# Patient Record
Sex: Male | Born: 1970 | Race: White | Hispanic: No | Marital: Married | Smoking: Never smoker
Health system: Southern US, Community
[De-identification: ages and names within clinical notes are randomized; demographics above are authoritative.]

## PROBLEM LIST (undated history)

## (undated) HISTORY — PX: HERNIA REPAIR: SHX51

---

## 2010-07-17 ENCOUNTER — Ambulatory Visit: Payer: Self-pay | Admitting: General Surgery

## 2010-12-02 ENCOUNTER — Ambulatory Visit: Payer: Self-pay

## 2011-01-01 ENCOUNTER — Ambulatory Visit: Payer: Self-pay | Admitting: Internal Medicine

## 2012-09-16 ENCOUNTER — Ambulatory Visit: Payer: Self-pay

## 2012-09-16 LAB — COMPREHENSIVE METABOLIC PANEL
Albumin: 4.2 g/dL (ref 3.4–5.0)
Anion Gap: 9 (ref 7–16)
BUN: 13 mg/dL (ref 7–18)
Chloride: 106 mmol/L (ref 98–107)
Co2: 29 mmol/L (ref 21–32)
Creatinine: 0.96 mg/dL (ref 0.60–1.30)
EGFR (African American): >60
EGFR (Non-African Amer.): >60
Glucose: 71 mg/dL (ref 65–99)
Potassium: 3.8 mmol/L (ref 3.5–5.1)
SGPT (ALT): 31 U/L (ref 12–78)
Sodium: 144 mmol/L (ref 136–145)

## 2012-09-16 LAB — CBC WITH DIFFERENTIAL/PLATELET
Basophil #: 0.1 10*3/uL (ref 0.0–0.1)
Basophil %: 1.5 %
Eosinophil #: 0.1 10*3/uL (ref 0.0–0.7)
HGB: 14.7 g/dL (ref 13.0–18.0)
Lymphocyte %: 27.7 %
MCHC: 33.8 g/dL (ref 32.0–36.0)
MCV: 86 fL (ref 80–100)
Monocyte %: 7.3 %
Neutrophil #: 2.6 10*3/uL (ref 1.4–6.5)
Neutrophil %: 60.7 %
RBC: 5.04 10*6/uL (ref 4.40–5.90)

## 2014-03-10 ENCOUNTER — Ambulatory Visit: Payer: Self-pay | Admitting: Physician Assistant

## 2014-10-07 ENCOUNTER — Ambulatory Visit
Admission: EM | Admit: 2014-10-07 | Discharge: 2014-10-07 | Disposition: A | Discharge status: Home / Self Care | Payer: Private Health Insurance - Indemnity | Attending: Family Medicine | Admitting: Family Medicine

## 2014-10-07 ENCOUNTER — Encounter: Payer: Self-pay | Admitting: Emergency Medicine

## 2014-10-07 DIAGNOSIS — J0100 Acute maxillary sinusitis, unspecified: Secondary | ICD-10-CM | POA: Diagnosis not present

## 2014-10-07 MED ORDER — AZITHROMYCIN 250 MG PO TABS: ORAL_TABLET | ORAL | Status: AC

## 2014-10-07 NOTE — ED Notes (Signed)
Patient c/o sinus pain and pressure on the right side of his face for a week.

## 2014-10-07 NOTE — ED Provider Notes (Signed)
CSN: [id number]     Arrival date & time 10/07/14  1046 History   First MD Initiated Contact with Patient 10/07/14 1126     Chief Complaint  Patient presents with  . Facial Pain   (Consider location/radiation/quality/duration/timing/severity/associated sxs/prior Treatment) Patient is a 44 y.o. male presenting with URI. The history is provided by the patient.  URI Presenting symptoms: congestion, facial pain and rhinorrhea   Severity:  Moderate Onset quality:  Sudden Duration:  3 weeks Timing:  Constant Progression:  Worsening Chronicity:  New Relieved by:  Nothing Associated symptoms: headaches and sinus pain   Associated symptoms: no arthralgias, no myalgias, no sneezing, no swollen glands and no wheezing     History reviewed. No pertinent past medical history. Past Surgical History  Procedure Laterality Date  . Hernia repair     History reviewed. No pertinent family history. History  Substance Use Topics  . Smoking status: Never Smoker   . Smokeless tobacco: Never Used  . Alcohol Use: Yes    Review of Systems  HENT: Positive for congestion and rhinorrhea. Negative for sneezing.   Respiratory: Negative for wheezing.   Musculoskeletal: Negative for myalgias and arthralgias.  Neurological: Positive for headaches.    Allergies  Shrimp and Penicillins  Home Medications   Prior to Admission medications   Medication Sig Start Date End Date Taking? Authorizing Provider  azithromycin (ZITHROMAX Z-PAK) 250 MG tablet 2 tabs po once on day 1, then 1 tab po qd for next 4 days 10/07/14   Payton Mccallum, MD   BP 125/86 mmHg  Pulse 85  Temp(Src) 97.4 F (36.3 C) (Tympanic)  Resp 16  Ht  (1.727 m)  Wt 178 lb (80.74 kg)  BMI 27.07 kg/m2  SpO2 100% Physical Exam  Constitutional: He appears well-developed and well-nourished. No distress.  HENT:  Head: Normocephalic and atraumatic.  Right Ear: External ear and ear canal normal. A middle ear effusion is present.  Left  Ear: Tympanic membrane, external ear and ear canal normal.  Nose: Right sinus exhibits maxillary sinus tenderness and frontal sinus tenderness. Left sinus exhibits no maxillary sinus tenderness and no frontal sinus tenderness.  Mouth/Throat: Uvula is midline and mucous membranes are normal. Posterior oropharyngeal erythema present. No oropharyngeal exudate, posterior oropharyngeal edema or tonsillar abscesses.  Eyes: Conjunctivae and EOM are normal. Pupils are equal, round, and reactive to light. Right eye exhibits no discharge. Left eye exhibits no discharge. No scleral icterus.  Neck: Normal range of motion. Neck supple. No tracheal deviation present. No thyromegaly present.  Cardiovascular: Normal rate, regular rhythm and normal heart sounds.   Pulmonary/Chest: Effort normal and breath sounds normal. No stridor. No respiratory distress. He has no wheezes. He has no rales. He exhibits no tenderness.  Lymphadenopathy:    He has no cervical adenopathy.  Neurological: He is alert.  Skin: Skin is warm and dry. No rash noted. He is not diaphoretic.  Nursing note and vitals reviewed.   ED Course  Procedures (including critical care time) Labs Review Labs Reviewed - No data to display  Imaging Review No results found.   MDM   1. Acute maxillary sinusitis, recurrence not specified    New Prescriptions   AZITHROMYCIN (ZITHROMAX Z-PAK) 250 MG TABLET    2 tabs po once on day 1, then 1 tab po qd for next 4 days   Plan: 1.  diagnosis reviewed with patient 2. rx as per orders; risks, benefits, potential side effects reviewed with patient 3. Recommend  supportive treatment with otc decongestants, steroid nasal spray 4. F/u prn if symptoms worsen or don't improve    Payton Mccallumrlando Keith Felten, MD 10/07/14 1142

## 2016-09-23 ENCOUNTER — Ambulatory Visit
Admission: EM | Admit: 2016-09-23 | Discharge: 2016-09-23 | Disposition: A | Discharge status: Home / Self Care | Payer: PRIVATE HEALTH INSURANCE | Attending: Family Medicine | Admitting: Family Medicine

## 2016-09-23 ENCOUNTER — Encounter: Payer: Self-pay | Admitting: *Deleted

## 2016-09-23 DIAGNOSIS — W57XXXA Bitten or stung by nonvenomous insect and other nonvenomous arthropods, initial encounter: Secondary | ICD-10-CM | POA: Diagnosis not present

## 2016-09-23 DIAGNOSIS — L03116 Cellulitis of left lower limb: Secondary | ICD-10-CM

## 2016-09-23 DIAGNOSIS — S70362A Insect bite (nonvenomous), left thigh, initial encounter: Secondary | ICD-10-CM | POA: Diagnosis not present

## 2016-09-23 MED ORDER — DOXYCYCLINE HYCLATE 100 MG PO CAPS: 100.0000 mg | ORAL_CAPSULE | Freq: Two times a day (BID) | ORAL | 0 refills | Status: AC

## 2016-09-23 MED ORDER — MUPIROCIN 2 % EX OINT: 1.0000 "application " | TOPICAL_OINTMENT | Freq: Two times a day (BID) | CUTANEOUS | 0 refills | Status: AC

## 2016-09-23 NOTE — ED Provider Notes (Signed)
[facility]    CSN: [id number] Arrival date & time: 09/23/16  1037     History   Chief Complaint Chief Complaint  Patient presents with  . Insect Bite    HPI Ansen Sayegh. is a 46 y.o. male.   Patient is a 46 year old white male who presents with his wife after removing a tick off of his left lower thigh on Saturday since the removal of the tick on the dorsal surface of his left leg he has developed redness and swelling and some drainage from the site of the tick bite. He was concerned and came in to be seen and evaluated. He's had a history of hernia repair but he has no chronic medical problems. According to his wife his family is very healthy no pertinent family medical history relevant to today's visit. He does not smoke. He is allergic to shellfish and penicillin.   The history is provided by the patient. No language interpreter was used.  Abscess  Location:  Leg Leg abscess location:  L upper leg Abscess quality: draining, induration, itching, painful, redness and warmth   Abscess quality: no fluctuance and not weeping   Duration:  2 days Progression:  Worsening Pain details:    Quality:  Sharp   Severity:  Mild   Progression:  Worsening Chronicity:  New Context: insect bite/sting   Context: not diabetes, not immunosuppression and not injected drug use     History reviewed. No pertinent past medical history.  There are no active problems to display for this patient.   Past Surgical History:  Procedure Laterality Date  . HERNIA REPAIR         Home Medications    Prior to Admission medications   Medication Sig Start Date End Date Taking? Authorizing Provider  azithromycin (ZITHROMAX Z-PAK) 250 MG tablet 2 tabs po once on day 1, then 1 tab po qd for next 4 days 10/07/14   Payton Mccallum, MD  doxycycline (VIBRAMYCIN) 100 MG capsule Take 1 capsule (100 mg total) by mouth 2 (two) times daily. 09/23/16   Hassan Rowan, MD  mupirocin ointment  (BACTROBAN) 2 % Apply 1 application topically 2 (two) times daily. 09/23/16   Hassan Rowan, MD    Family History History reviewed. No pertinent family history.  Social History Social History  Substance Use Topics  . Smoking status: Never Smoker  . Smokeless tobacco: Never Used  . Alcohol use Yes     Allergies   Shrimp [shellfish allergy] and Penicillins   Review of Systems Review of Systems  Skin: Positive for rash and wound.     Physical Exam Triage Vital Signs ED Triage Vitals  Enc Vitals Group     BP 09/23/16 1108 124/88     Pulse Rate 09/23/16 1108 71     Resp 09/23/16 1108 16     Temp 09/23/16 1108 97.9 F (36.6 C)     Temp Source 09/23/16 1108 Oral     SpO2 09/23/16 1108 99 %     Weight 09/23/16 1109 177 lb (80.3 kg)     Height 09/23/16 1109 5\' 7"  (1.702 m)     Head Circumference --      Peak Flow --      Pain Score --      Pain Loc --      Pain Edu? --      Excl. in GC? --    No data found.   Updated Vital Signs BP 124/88 (  BP Location: Left Arm)   Pulse 71   Temp 97.9 F (36.6 C) (Oral)   Resp 16   Ht 5\' 7"  (1.702 m)   Wt 177 lb (80.3 kg)   SpO2 99%   BMI 27.72 kg/m   Visual Acuity Right Eye Distance:   Left Eye Distance:   Bilateral Distance:    Right Eye Near:   Left Eye Near:    Bilateral Near:     Physical Exam  Constitutional: He is oriented to person, place, and time. He appears well-developed and well-nourished.  HENT:  Head: Normocephalic and atraumatic.  Eyes: Pupils are equal, round, and reactive to light.  Neck: Normal range of motion.  Pulmonary/Chest: Effort normal.  Musculoskeletal: Normal range of motion.  Neurological: He is alert and oriented to person, place, and time.  Skin: Rash noted. There is erythema.     Psychiatric: He has a normal mood and affect.  Vitals reviewed.    UC Treatments / Results  Labs (all labs ordered are listed, but only abnormal results are displayed) Labs Reviewed - No data to  display  EKG  EKG Interpretation None       Radiology No results found.  Procedures Procedures (including critical care time)  Medications Ordered in UC Medications - No data to display   Initial Impression / Assessment and Plan / UC Course  I have reviewed the triage vital signs and the nursing notes.  Pertinent labs & imaging results that were available during my care of the patient were reviewed by me and considered in my medical decision making (see chart for details).     Patient has a rash on his left thigh consistent with a early cellulitis/abscess. We'll place on doxycycline 100 mg twice a day back pain ointment to 3 times a day as well return here with PCP in 2-3 days if not better. Explained to him and his wife that I have very low index to obtain RMSF Lyme's titer studies however since we are going to place him on Doxy for the cellulitis and the back pain ointment which is treatment of choice for RMSF and Lyme's if he does not want to have blood work done him comfortable with that and he has declined blood work at this time. Work note for the next 2 days.  Final Clinical Impressions(s) / UC Diagnoses   Final diagnoses:  Tick bite, initial encounter  Insect bite, initial encounter  Cellulitis of left lower extremity    New Prescriptions New Prescriptions   DOXYCYCLINE (VIBRAMYCIN) 100 MG CAPSULE    Take 1 capsule (100 mg total) by mouth 2 (two) times daily.   MUPIROCIN OINTMENT (BACTROBAN) 2 %    Apply 1 application topically 2 (two) times daily.   Note: This dictation was prepared with Dragon dictation along with smaller phrase technology. Any transcriptional errors that result from this process are unintentional.   Hassan RowanWade, Render Marley, MD 09/23/16 1143

## 2016-09-23 NOTE — ED Triage Notes (Signed)
Pt removed a tick from his left posterior thigh Saturday. Site is now red, itching, and swollen.

## 2017-01-15 ENCOUNTER — Other Ambulatory Visit: Payer: Self-pay

## 2017-01-15 ENCOUNTER — Ambulatory Visit
Admission: EM | Admit: 2017-01-15 | Discharge: 2017-01-15 | Disposition: A | Discharge status: Home / Self Care | Payer: 59 | Attending: Family Medicine | Admitting: Family Medicine

## 2017-01-15 ENCOUNTER — Ambulatory Visit (INDEPENDENT_AMBULATORY_CARE_PROVIDER_SITE_OTHER): Payer: 59

## 2017-01-15 DIAGNOSIS — R079 Chest pain, unspecified: Secondary | ICD-10-CM | POA: Diagnosis not present

## 2017-01-15 DIAGNOSIS — Z880 Allergy status to penicillin: Secondary | ICD-10-CM | POA: Insufficient documentation

## 2017-01-15 DIAGNOSIS — Z79899 Other long term (current) drug therapy: Secondary | ICD-10-CM | POA: Diagnosis not present

## 2017-01-15 DIAGNOSIS — R05 Cough: Secondary | ICD-10-CM | POA: Diagnosis not present

## 2017-01-15 DIAGNOSIS — Z91013 Allergy to seafood: Secondary | ICD-10-CM | POA: Diagnosis not present

## 2017-01-15 DIAGNOSIS — R110 Nausea: Secondary | ICD-10-CM | POA: Diagnosis not present

## 2017-01-15 DIAGNOSIS — J029 Acute pharyngitis, unspecified: Secondary | ICD-10-CM | POA: Diagnosis not present

## 2017-01-15 DIAGNOSIS — R0789 Other chest pain: Secondary | ICD-10-CM | POA: Diagnosis not present

## 2017-01-15 DIAGNOSIS — R5383 Other fatigue: Secondary | ICD-10-CM | POA: Insufficient documentation

## 2017-01-15 LAB — BASIC METABOLIC PANEL
Anion gap: 8 (ref 5–15)
BUN: 13 mg/dL (ref 6–20)
CHLORIDE: 104 mmol/L (ref 101–111)
CO2: 26 mmol/L (ref 22–32)
Calcium: 9.3 mg/dL (ref 8.9–10.3)
Creatinine, Ser: 0.82 mg/dL (ref 0.61–1.24)
GFR calc Af Amer: >60 mL/min (ref >60)
GFR calc non Af Amer: >60 mL/min (ref >60)
GLUCOSE: 107 mg/dL — AB (ref 65–99)
POTASSIUM: 3.8 mmol/L (ref 3.5–5.1)
Sodium: 138 mmol/L (ref 135–145)

## 2017-01-15 LAB — CBC WITH DIFFERENTIAL/PLATELET
Basophils Absolute: 0 10*3/uL (ref 0–0.1)
Basophils Relative: 1 %
EOS PCT: 3 %
Eosinophils Absolute: 0.1 10*3/uL (ref 0–0.7)
HCT: 42.8 % (ref 40.0–52.0)
Hemoglobin: 14.7 g/dL (ref 13.0–18.0)
LYMPHS ABS: 1.2 10*3/uL (ref 1.0–3.6)
LYMPHS PCT: 31 %
MCH: 29.8 pg (ref 26.0–34.0)
MCHC: 34.4 g/dL (ref 32.0–36.0)
MCV: 86.7 fL (ref 80.0–100.0)
MONO ABS: 0.2 10*3/uL (ref 0.2–1.0)
MONOS PCT: 6 %
Neutro Abs: 2.3 10*3/uL (ref 1.4–6.5)
Neutrophils Relative %: 59 %
PLATELETS: 193 10*3/uL (ref 150–440)
RBC: 4.94 MIL/uL (ref 4.40–5.90)
RDW: 13.3 % (ref 11.5–14.5)
WBC: 3.9 10*3/uL (ref 3.8–10.6)

## 2017-01-15 MED ORDER — ISOSORBIDE MONONITRATE ER 30 MG PO TB24
30.0000 mg | ORAL_TABLET | Freq: Every day | ORAL | 0 refills | Status: AC
Start: 2017-01-15 — End: ?

## 2017-01-15 MED ORDER — NITROGLYCERIN 0.4 MG SL SUBL: 0.4000 mg | SUBLINGUAL_TABLET | SUBLINGUAL | 0 refills | Status: AC | PRN

## 2017-01-15 NOTE — ED Provider Notes (Signed)
[facility]    CSN: [id number] Arrival date & time: 01/15/17  1113     History   Chief Complaint Chief Complaint  Patient presents with  . Fatigue    HPI Dale Phillips. is a 46 y.o. male.   The history is provided by the patient.  Chest Pain  Pain location:  Substernal area Pain quality: pressure   Pain radiates to:  Does not radiate Pain severity:  Mild Onset quality:  Sudden Duration:  1 month Timing:  Intermittent Progression:  Unchanged Chronicity:  Recurrent Context comment:  Exertion Relieved by:  Rest Worsened by:  Exertion Ineffective treatments:  None tried Associated symptoms: fatigue and nausea   Associated symptoms: no abdominal pain, no AICD problem, no altered mental status, no anorexia, no anxiety, no back pain, no claudication, no cough, no diaphoresis, no dizziness, no dysphagia, no fever, no headache, no heartburn, no lower extremity edema, no near-syncope, no numbness, no orthopnea, no palpitations, no PND, no shortness of breath, no syncope, no vomiting and no weakness     History reviewed. No pertinent past medical history.  There are no active problems to display for this patient.   Past Surgical History:  Procedure Laterality Date  . HERNIA REPAIR         Home Medications    Prior to Admission medications   Medication Sig Start Date End Date Taking? Authorizing Provider  azithromycin (ZITHROMAX Z-PAK) 250 MG tablet 2 tabs po once on day 1, then 1 tab po qd for next 4 days 10/07/14   Payton Mccallum, MD  doxycycline (VIBRAMYCIN) 100 MG capsule Take 1 capsule (100 mg total) by mouth 2 (two) times daily. 09/23/16   Hassan Rowan, MD  isosorbide mononitrate (IMDUR) 30 MG 24 hr tablet Take 1 tablet (30 mg total) by mouth daily. 01/15/17   Payton Mccallum, MD  mupirocin ointment (BACTROBAN) 2 % Apply 1 application topically 2 (two) times daily. 09/23/16   Hassan Rowan, MD  nitroGLYCERIN (NITROSTAT) 0.4 MG SL tablet Place 1  tablet (0.4 mg total) under the tongue every 5 (five) minutes as needed for chest pain. 01/15/17   Payton Mccallum, MD    Family History History reviewed. No pertinent family history.  Social History Social History  Substance Use Topics  . Smoking status: Never Smoker  . Smokeless tobacco: Never Used  . Alcohol use Yes     Comment: social     Allergies   Shrimp [shellfish allergy] and Penicillins   Review of Systems Review of Systems  Constitutional: Positive for fatigue. Negative for diaphoresis and fever.  HENT: Negative for trouble swallowing.   Respiratory: Negative for cough and shortness of breath.   Cardiovascular: Positive for chest pain. Negative for palpitations, orthopnea, claudication, syncope, PND and near-syncope.  Gastrointestinal: Positive for nausea. Negative for abdominal pain, anorexia, heartburn and vomiting.  Musculoskeletal: Negative for back pain.  Neurological: Negative for dizziness, weakness, numbness and headaches.     Physical Exam Triage Vital Signs ED Triage Vitals [01/15/17 1124]  Enc Vitals Group     BP (!) 135/92     Pulse Rate 75     Resp 18     Temp 97.7 F (36.5 C)     Temp Source Oral     SpO2 100 %     Weight 180 lb (81.6 kg)     Height 5\' 9"  (1.753 m)     Head Circumference      Peak Flow  Pain Score 2     Pain Loc      Pain Edu?      Excl. in GC?    No data found.   Updated Vital Signs BP (!) 135/92 (BP Location: Right Arm)   Pulse 75   Temp 97.7 F (36.5 C) (Oral)   Resp 18   Ht 5\' 9"  (1.753 m)   Wt 180 lb (81.6 kg)   SpO2 100%   BMI 26.58 kg/m   Visual Acuity Right Eye Distance:   Left Eye Distance:   Bilateral Distance:    Right Eye Near:   Left Eye Near:    Bilateral Near:     Physical Exam   UC Treatments / Results  Labs (all labs ordered are listed, but only abnormal results are displayed) Labs Reviewed  BASIC METABOLIC PANEL - Abnormal; Notable for the following:       Result Value    Glucose, Bld 107 (*)    All other components within normal limits  CBC WITH DIFFERENTIAL/PLATELET    EKG  EKG Interpretation None       Radiology Dg Chest 2 View  Result Date: 01/15/2017 CLINICAL DATA:  Intermittent upper anterior chest pain for the past month with some pressures sensation more recently. Three near syncopal episodes during exercise over the past 2 weeks. Onset of nonproductive cough and sore throat this past week. EXAM: CHEST  2 VIEW COMPARISON:  None in PACs FINDINGS: The lungs are adequately inflated and clear. The heart and pulmonary vascularity are normal. The mediastinum is normal in width. There is no pleural effusion or pneumothorax. The bony thorax is unremarkable. IMPRESSION: There is no active cardiopulmonary disease. Electronically Signed   By: David  [place] M.D.   On: 01/15/2017 12:14    Procedures ED EKG Date/Time: 01/15/2017 1:02 PM Performed by: Payton Mccallum Authorized by: Payton Mccallum   ECG reviewed by ED Physician in the absence of a cardiologist: yes   Previous ECG:    Previous ECG:  Unavailable Interpretation:    Interpretation: normal   Rate:    ECG rate assessment: normal   Rhythm:    Rhythm: sinus rhythm   Ectopy:    Ectopy: none   QRS:    QRS axis:  Normal   QRS intervals:  Normal Conduction:    Conduction: normal   ST segments:    ST segments:  Normal T waves:    T waves: normal     (including critical care time)  Medications Ordered in UC Medications - No data to display   Initial Impression / Assessment and Plan / UC Course  I have reviewed the triage vital signs and the nursing notes.  Pertinent labs & imaging results that were available during my care of the patient were reviewed by me and considered in my medical decision making (see chart for details).       Final Clinical Impressions(s) / UC Diagnoses   Final diagnoses:  Chest pain, unspecified type    New Prescriptions Discharge Medication List as  of 01/15/2017 12:42 PM    START taking these medications   Details  isosorbide mononitrate (IMDUR) 30 MG 24 hr tablet Take 1 tablet (30 mg total) by mouth daily., Starting Wed 01/15/2017, Normal    nitroGLYCERIN (NITROSTAT) 0.4 MG SL tablet Place 1 tablet (0.4 mg total) under the tongue every 5 (five) minutes as needed for chest pain., Starting Wed 01/15/2017, Normal       1. Labs/x-ray results  and diagnosis reviewed with patient 2. rx as per orders above; reviewed possible side effects, interactions, risks and benefits  3. Recommend supportive treatment with 81mg  ASA daily  4. Follow up with PCP for further evaluation and or referral  5. Follow-up prn if symptoms worsen or don't improve Controlled Substance Prescriptions [place] Controlled Substance Registry consulted? Not Applicable   Payton Mccallumonty, Shiloh Southern, MD 01/15/17 1304

## 2017-01-15 NOTE — Discharge Instructions (Signed)
Follow up with primary care for further work up or referral

## 2017-01-15 NOTE — ED Triage Notes (Signed)
Pt reports one month of fatigue, intermittent chest pressure, and yesterday started with right sided sore throat. Today just feels some heaviness in his chest.

## 2018-05-10 IMAGING — CR DG CHEST 2V
2 series · 3 of 3 positions shown · non-contrast
Comparison: None in PACs

CLINICAL DATA: Intermittent upper anterior chest pain for the past
month with some pressures sensation more recently. Three near
syncopal episodes during exercise over the past 2 weeks. Onset of
nonproductive cough and sore throat this past week.

EXAM:
CHEST  2 VIEW

[Series 1: chest pa · 0.14mm/px · 2 of 2 slices shown]
[im 1/2]
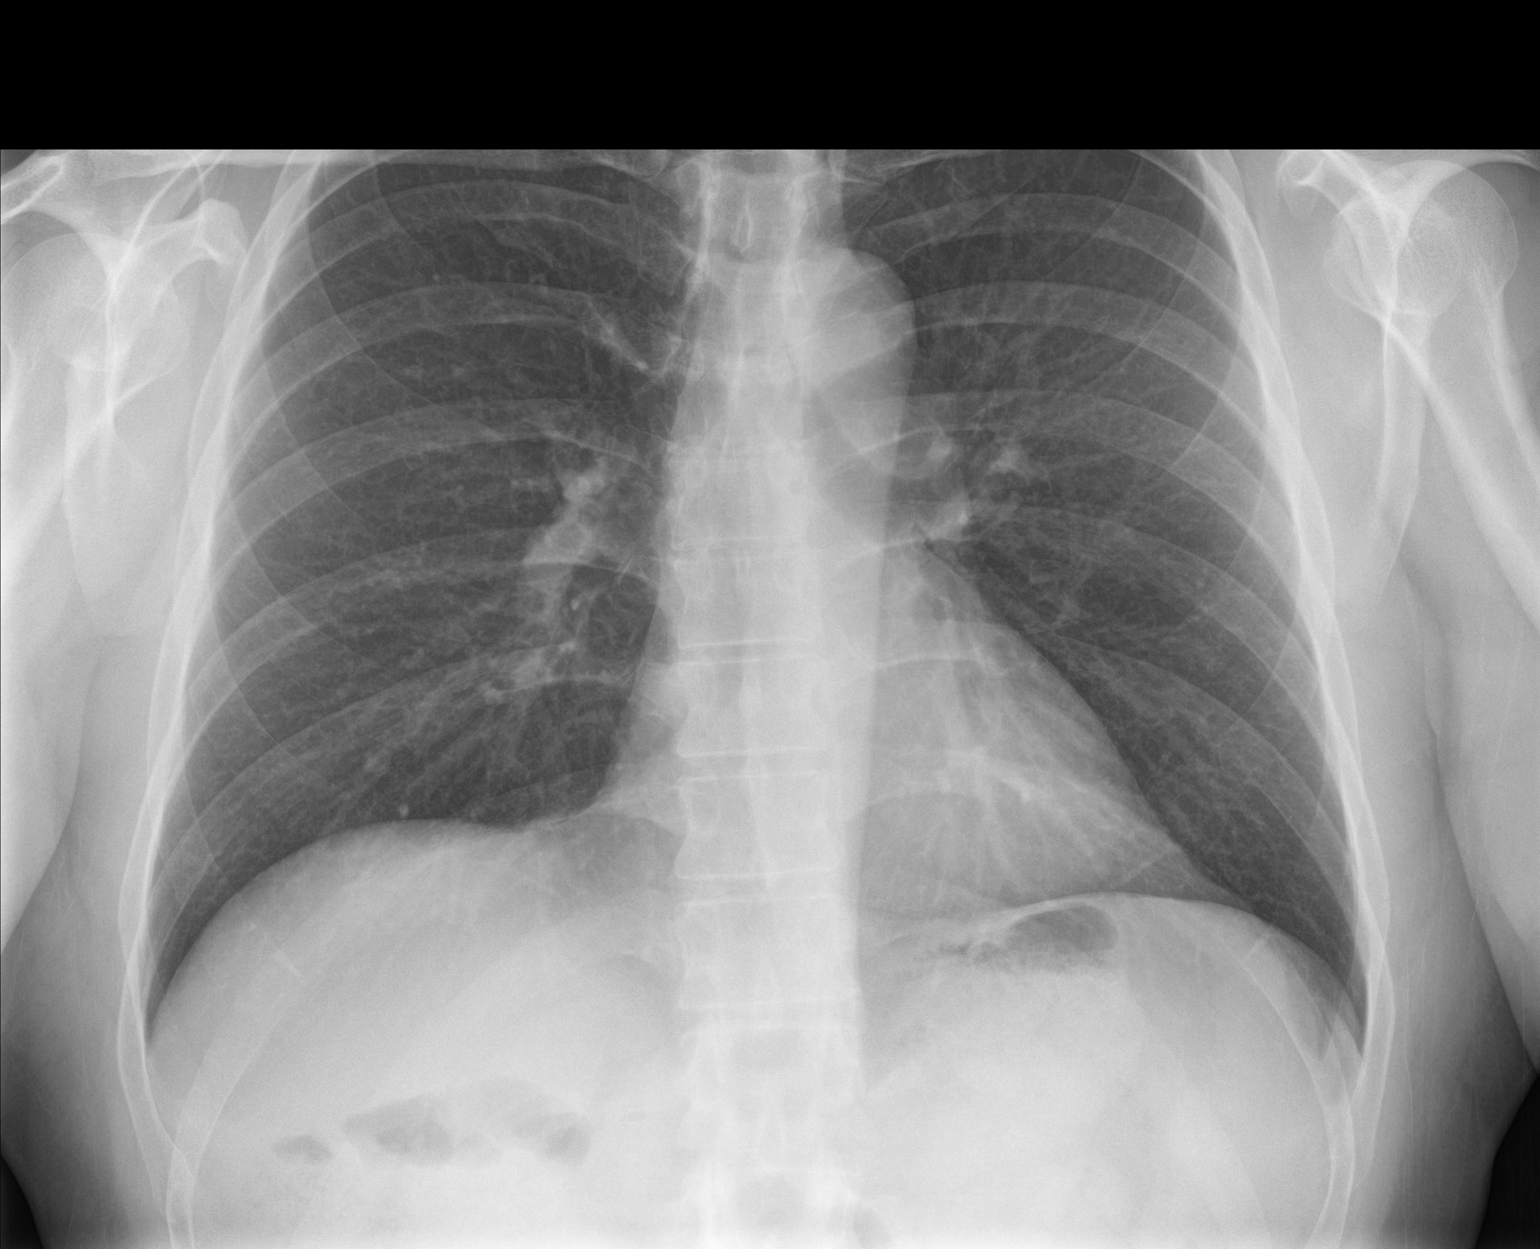
[im 2/2]
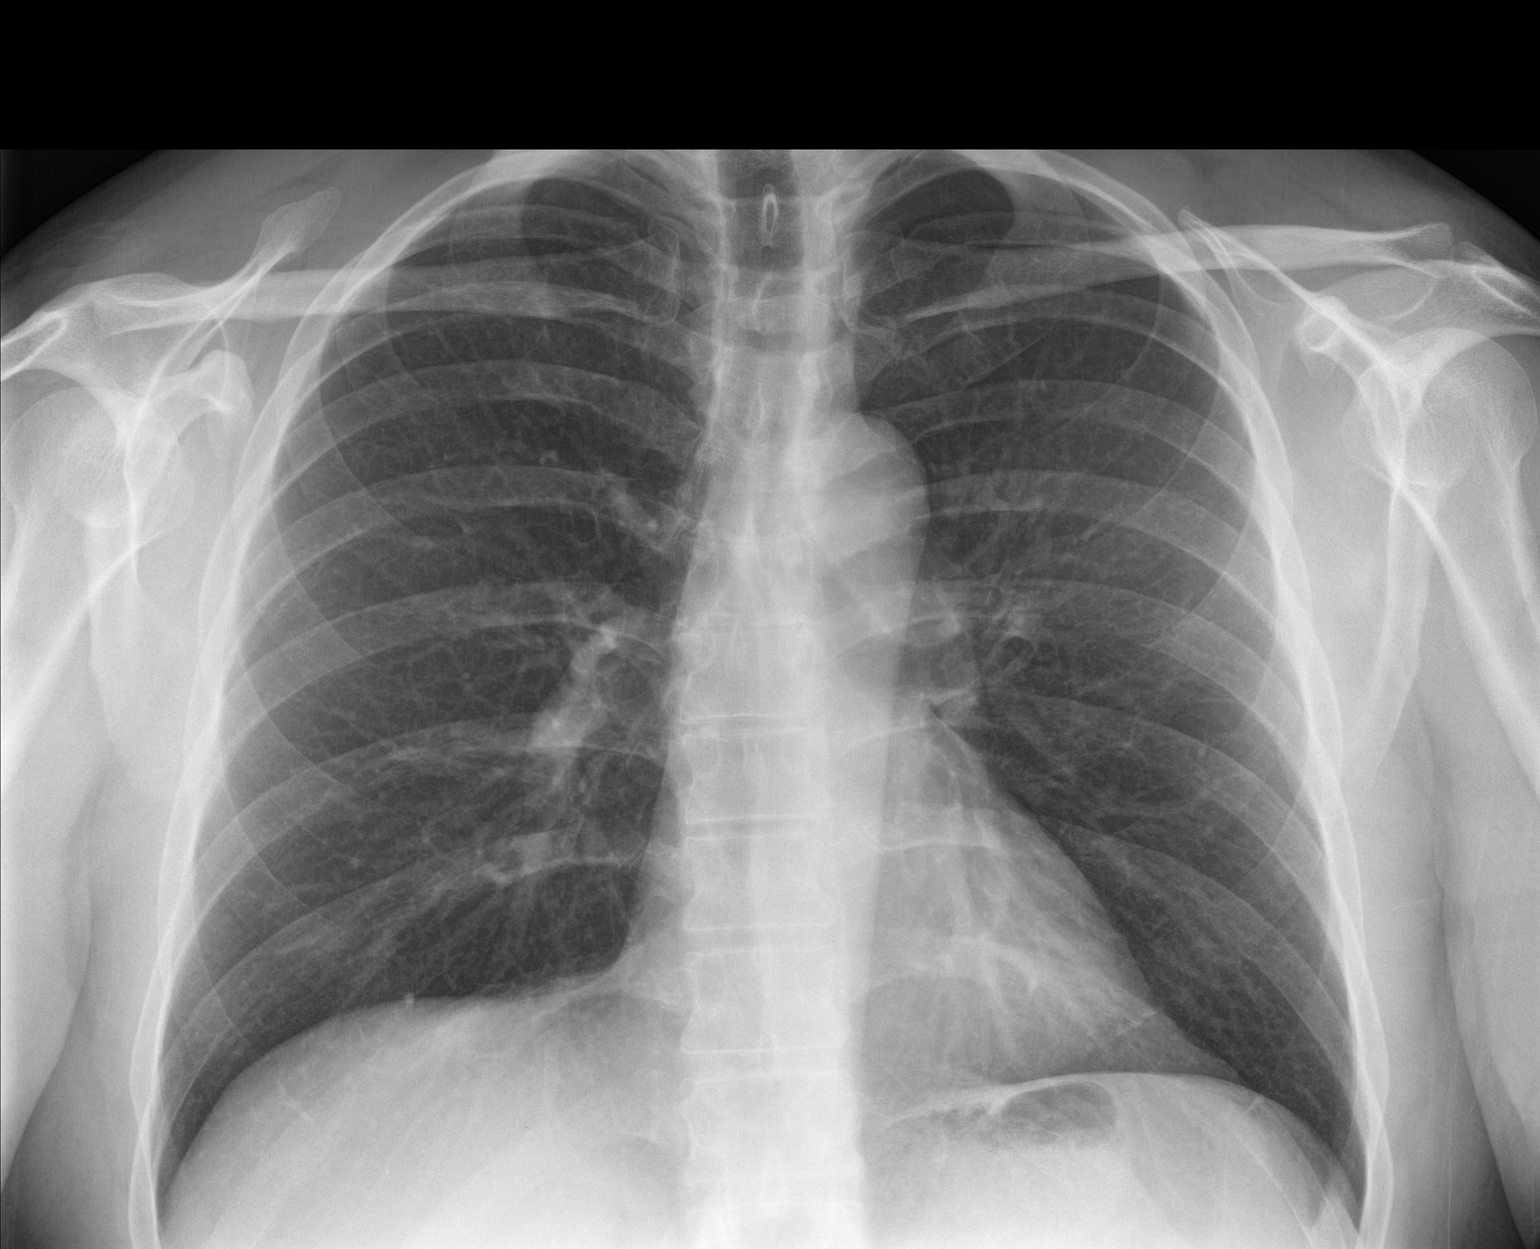

[chest lat]
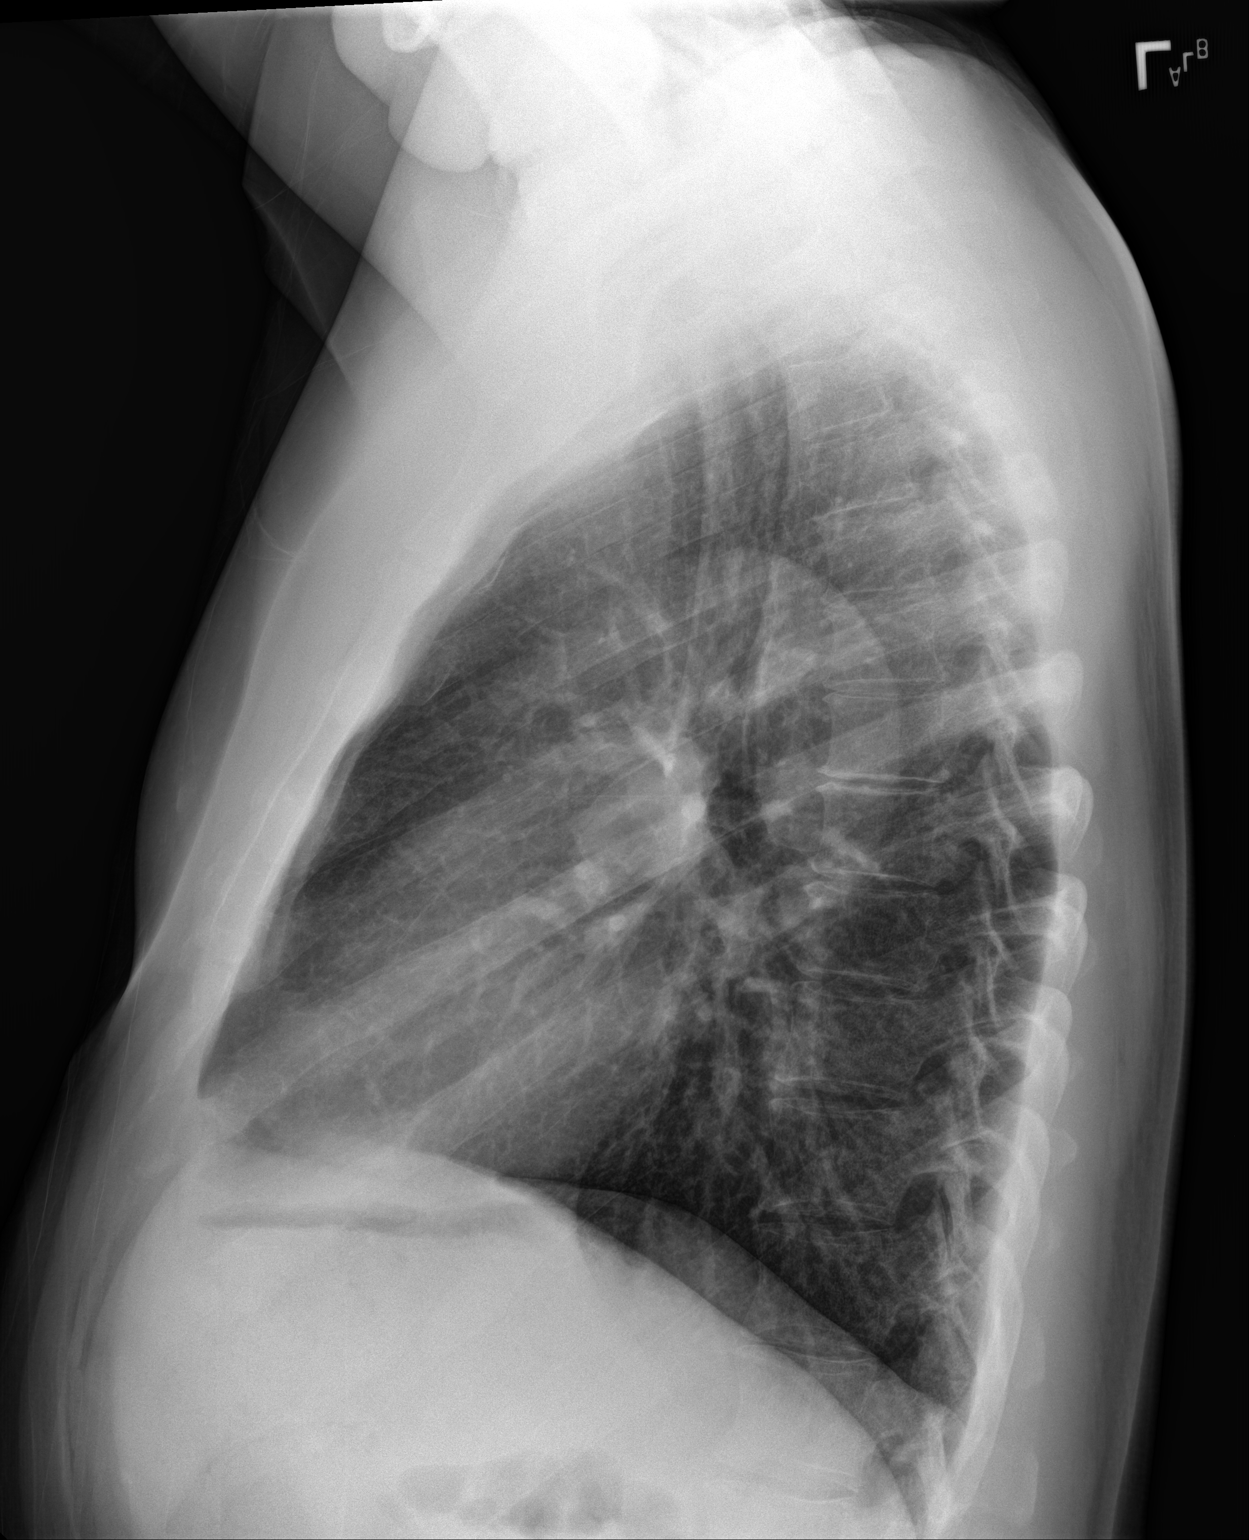

[3 of 3 positions shown; findings below may reference images not displayed]

FINDINGS: The lungs are adequately inflated and clear. The heart and pulmonary
vascularity are normal. The mediastinum is normal in width. There is
no pleural effusion or pneumothorax. The bony thorax is
unremarkable.
IMPRESSION: There is no active cardiopulmonary disease.

## 2019-03-25 ENCOUNTER — Ambulatory Visit: Payer: 59 | Attending: Internal Medicine

## 2019-03-25 DIAGNOSIS — Z20822 Contact with and (suspected) exposure to covid-19: Secondary | ICD-10-CM

## 2019-03-26 LAB — NOVEL CORONAVIRUS, NAA: SARS-CoV-2, NAA: NOT DETECTED

## 2019-04-20 ENCOUNTER — Ambulatory Visit: Payer: 59 | Attending: Internal Medicine

## 2019-04-20 DIAGNOSIS — Z20822 Contact with and (suspected) exposure to covid-19: Secondary | ICD-10-CM

## 2019-04-21 LAB — NOVEL CORONAVIRUS, NAA: SARS-CoV-2, NAA: NOT DETECTED

## 2019-06-22 ENCOUNTER — Other Ambulatory Visit: Payer: Self-pay | Admitting: Family Medicine

## 2019-06-22 DIAGNOSIS — Z20822 Contact with and (suspected) exposure to covid-19: Secondary | ICD-10-CM

## 2019-12-01 ENCOUNTER — Other Ambulatory Visit: Payer: Self-pay

## 2019-12-01 ENCOUNTER — Other Ambulatory Visit: Payer: Self-pay | Admitting: Critical Care Medicine

## 2019-12-01 DIAGNOSIS — Z20822 Contact with and (suspected) exposure to covid-19: Secondary | ICD-10-CM

## 2019-12-03 LAB — NOVEL CORONAVIRUS, NAA: SARS-CoV-2, NAA: NOT DETECTED

## 2020-01-05 ENCOUNTER — Other Ambulatory Visit: Payer: 59

## 2020-01-05 DIAGNOSIS — Z20822 Contact with and (suspected) exposure to covid-19: Secondary | ICD-10-CM

## 2020-01-06 LAB — NOVEL CORONAVIRUS, NAA: SARS-CoV-2, NAA: NOT DETECTED

## 2020-01-06 LAB — SARS-COV-2, NAA 2 DAY TAT

## 2020-03-11 ENCOUNTER — Ambulatory Visit: Payer: 59 | Attending: Internal Medicine

## 2020-03-11 DIAGNOSIS — Z23 Encounter for immunization: Secondary | ICD-10-CM

## 2020-03-11 NOTE — Progress Notes (Signed)
   [id number]  Name:  Dale Phillips.    MRN: [id number] DOB: 12/07/1970  03/11/2020  Dale Phillips was observed post Covid-19 immunization for 15 minutes without incident. He was provided with Vaccine Information Sheet and instruction to access the V-Safe system.   Dale Phillips was instructed to call 911 with any severe reactions post vaccine: Marland Kitchen Difficulty breathing  . Swelling of face and throat  . A fast heartbeat  . A bad rash all over body  . Dizziness and weakness   Immunizations Administered    Name Date Dose VIS Date Route   Pfizer COVID-19 Vaccine 03/11/2020  2:30 PM 0.3 mL 01/19/2020 Intramuscular   Manufacturer: [employer], [employer]   Lot: [id number]   NDC: [id number]

## 2020-04-30 ENCOUNTER — Emergency Department: Payer: 59

## 2020-04-30 ENCOUNTER — Encounter: Payer: Self-pay | Admitting: Emergency Medicine

## 2020-04-30 ENCOUNTER — Other Ambulatory Visit: Payer: Self-pay

## 2020-04-30 DIAGNOSIS — R739 Hyperglycemia, unspecified: Secondary | ICD-10-CM | POA: Diagnosis not present

## 2020-04-30 DIAGNOSIS — R079 Chest pain, unspecified: Secondary | ICD-10-CM | POA: Diagnosis present

## 2020-04-30 DIAGNOSIS — R202 Paresthesia of skin: Secondary | ICD-10-CM | POA: Diagnosis not present

## 2020-04-30 DIAGNOSIS — E876 Hypokalemia: Secondary | ICD-10-CM | POA: Diagnosis not present

## 2020-04-30 LAB — TROPONIN I (HIGH SENSITIVITY)
Troponin I (High Sensitivity): <2 ng/L (ref <18)
Troponin I (High Sensitivity): <2 ng/L (ref <18)

## 2020-04-30 LAB — BASIC METABOLIC PANEL
Anion gap: 11 (ref 5–15)
BUN: 14 mg/dL (ref 6–20)
CO2: 25 mmol/L (ref 22–32)
Calcium: 9.4 mg/dL (ref 8.9–10.3)
Chloride: 104 mmol/L (ref 98–111)
Creatinine, Ser: 0.84 mg/dL (ref 0.61–1.24)
GFR, Estimated: >60 mL/min (ref >60)
Glucose, Bld: 160 mg/dL — ABNORMAL HIGH (ref 70–99)
Potassium: 2.9 mmol/L — ABNORMAL LOW (ref 3.5–5.1)
Sodium: 140 mmol/L (ref 135–145)

## 2020-04-30 LAB — CBC
HCT: 46 % (ref 39.0–52.0)
Hemoglobin: 16 g/dL (ref 13.0–17.0)
MCH: 29.9 pg (ref 26.0–34.0)
MCHC: 34.8 g/dL (ref 30.0–36.0)
MCV: 85.8 fL (ref 80.0–100.0)
Platelets: 238 10*3/uL (ref 150–400)
RBC: 5.36 MIL/uL (ref 4.22–5.81)
RDW: 12.7 % (ref 11.5–15.5)
WBC: 5.6 10*3/uL (ref 4.0–10.5)
nRBC: 0 % (ref 0.0–0.2)

## 2020-04-30 NOTE — ED Triage Notes (Signed)
Patient ambulatory to triage with complaints of left arm pain (at tricep) this am (5/10) that then moved to left chest and arm subsided to (3/10), pt reports some GI discomfort, and "foggy headed", also having fatigue, pt denies SOB N/V  Pt reports new exercise regime of the last few weeks  - cardio that would cause left arm pain   Pt reports taking IBU at lunch time without relief.   Speaking in complete coherent sentences. No acute breathing distress noted.

## 2020-05-01 ENCOUNTER — Other Ambulatory Visit: Payer: Self-pay

## 2020-05-01 ENCOUNTER — Emergency Department
Admission: EM | Admit: 2020-05-01 | Discharge: 2020-05-01 | Disposition: A | Discharge status: Home / Self Care | Payer: 59 | Attending: Emergency Medicine | Admitting: Emergency Medicine

## 2020-05-01 DIAGNOSIS — E876 Hypokalemia: Secondary | ICD-10-CM

## 2020-05-01 DIAGNOSIS — R079 Chest pain, unspecified: Secondary | ICD-10-CM

## 2020-05-01 DIAGNOSIS — R739 Hyperglycemia, unspecified: Secondary | ICD-10-CM

## 2020-05-01 MED ORDER — POTASSIUM CHLORIDE CRYS ER 20 MEQ PO TBCR
40.0000 meq | EXTENDED_RELEASE_TABLET | Freq: Once | ORAL | Status: AC
  Administered 2020-05-01: 40 meq via ORAL
  Filled 2020-05-01: qty 2

## 2020-05-01 MED ORDER — ASPIRIN 81 MG PO CHEW
324.0000 mg | CHEWABLE_TABLET | Freq: Once | ORAL | Status: AC
  Administered 2020-05-01: 324 mg via ORAL
  Filled 2020-05-01: qty 4

## 2020-05-01 NOTE — ED Provider Notes (Signed)
[facility] Emergency Department Provider Note  ____________________________________________  Time seen: Approximately 3:07 AM  I have reviewed the triage vital signs and the nursing notes.   HISTORY  Chief Complaint Chest Pain and Fatigue   HPI Dale Phillips. is a 50 y.o. male no significant past medical history who presents for evaluation of chest pain.  Has had intermittent L arm dull squeezing pain for 1 week with some R hand paresthesias. Today started to have mild L sided chest tightness associated with the arm pain. No SOB, no cough, no fever. Brother just recently had a massive heart attack. Patient is not smoker. Patient started to exercise recently lifting weights and doing cardio. Has noticed that 4-5 min into cardio exercises he develops a pain in his L axilla which resolves after 15 min of rest. Pain ahs now resolved.   History reviewed. No pertinent past medical history.  There are no problems to display for this patient.   Past Surgical History:  Procedure Laterality Date  . HERNIA REPAIR      Prior to Admission medications   Medication Sig Start Date End Date Taking? Authorizing Provider  azithromycin (ZITHROMAX Z-PAK) 250 MG tablet 2 tabs po once on day 1, then 1 tab po qd for next 4 days 10/07/14   Payton Mccallum, MD  doxycycline (VIBRAMYCIN) 100 MG capsule Take 1 capsule (100 mg total) by mouth 2 (two) times daily. 09/23/16   Hassan Rowan, MD  isosorbide mononitrate (IMDUR) 30 MG 24 hr tablet Take 1 tablet (30 mg total) by mouth daily. 01/15/17   Payton Mccallum, MD  mupirocin ointment (BACTROBAN) 2 % Apply 1 application topically 2 (two) times daily. 09/23/16   Hassan Rowan, MD  nitroGLYCERIN (NITROSTAT) 0.4 MG SL tablet Place 1 tablet (0.4 mg total) under the tongue every 5 (five) minutes as needed for chest pain. 01/15/17   Payton Mccallum, MD    Allergies Shrimp [shellfish allergy] and Penicillins  History reviewed. No  pertinent family history.  Social History Social History   Tobacco Use  . Smoking status: Never Smoker  . Smokeless tobacco: Never Used  Substance Use Topics  . Alcohol use: Yes    Comment: social  . Drug use: No    Review of Systems  Constitutional: Negative for fever. Eyes: Negative for visual changes. ENT: Negative for sore throat. Neck: No neck pain  Cardiovascular: + chest pain. Respiratory: Negative for shortness of breath. Gastrointestinal: Negative for abdominal pain, vomiting or diarrhea. Genitourinary: Negative for dysuria. Musculoskeletal: Negative for back pain. + L arm pain Skin: Negative for rash. Neurological: Negative for headaches, weakness or numbness. Psych: No SI or HI  ____________________________________________   PHYSICAL EXAM:  VITAL SIGNS: ED Triage Vitals  Enc Vitals Group     BP 04/30/20 2017 (!) 149/89     Pulse Rate 04/30/20 2017 89     Resp 04/30/20 2017 18     Temp 04/30/20 2017 97.7 F (36.5 C)     Temp Source 04/30/20 2017 Oral     SpO2 04/30/20 2017 96 %     Weight 04/30/20 2017 186 lb (84.4 kg)     Height 04/30/20 2035 5\' 8"  (1.727 m)     Head Circumference --      Peak Flow --      Pain Score 04/30/20 2034 3     Pain Loc --      Pain Edu? --      Excl. in GC? --  Constitutional: Alert and oriented. Well appearing and in no apparent distress. HEENT:      Head: Normocephalic and atraumatic.         Eyes: Conjunctivae are normal. Sclera is non-icteric.       Mouth/Throat: Mucous membranes are moist.       Neck: Supple with no signs of meningismus. Cardiovascular: Regular rate and rhythm. No murmurs, gallops, or rubs. 2+ symmetrical distal pulses are present in all extremities. No JVD. Respiratory: Normal respiratory effort. Lungs are clear to auscultation bilaterally.  Gastrointestinal: Soft, non tender. Musculoskeletal: No edema, cyanosis, or erythema of extremities. Neurologic: Normal speech and language. Face is  symmetric. Moving all extremities. No gross focal neurologic deficits are appreciated. Skin: Skin is warm, dry and intact. No rash noted. Psychiatric: Mood and affect are normal. Speech and behavior are normal.  ____________________________________________   LABS (all labs ordered are listed, but only abnormal results are displayed)  Labs Reviewed  BASIC METABOLIC PANEL - Abnormal; Notable for the following components:      Result Value   Potassium 2.9 (*)    Glucose, Bld 160 (*)    All other components within normal limits  CBC  TROPONIN I (HIGH SENSITIVITY)  TROPONIN I (HIGH SENSITIVITY)   ____________________________________________  EKG  ED ECG REPORT I, Nita Sickle, the attending physician, personally viewed and interpreted this ECG.  Normal sinus rhythm, rate of 95, normal intervals, normal axis, no ST elevations or depressions.  Normal EKG.  Normal sinus rhythm, normal intervals, rate of 71, no ST elevations or depressions.  Unchanged from initial ____________________________________________  RADIOLOGY  I have personally reviewed the images performed during this visit and I agree with the Radiologist's read.   Interpretation by Radiologist:  DG Chest 2 View  Result Date: 04/30/2020 CLINICAL DATA:  Chest pain with left-sided radiation. EXAM: CHEST - 2 VIEW COMPARISON:  Radiograph 01/15/2017 FINDINGS: Normal heart size and mediastinal contours. There is subsegmental opacities in both lung bases. No confluent consolidation. No pleural fluid or pneumothorax. No acute osseous abnormalities are seen. IMPRESSION: Subsegmental bibasilar opacities, favor atelectasis. Electronically Signed   By: Narda Rutherford M.D.   On: 04/30/2020 21:17     ____________________________________________   PROCEDURES  Procedure(s) performed: None Procedures Critical Care performed:  None ____________________________________________   INITIAL IMPRESSION / ASSESSMENT AND PLAN /  ED COURSE  50 y.o. male with no PMH and FH of heart disease who presents for evaluation of CP and L arm pain. Also noticed L axilla pain with aerobic exercise. Patient is well appearing and no distress, normal vitals, normal pulses, no pain with palpation with the muscles of arm and chest, lungs CTAB, RRR. Heart score of 4 for history and FH. NO pain at this time.  EKG x 2 normal. HS trop x 2 negative. CXR WNL. Labs reviewed showing hypoK with was supplemented PO and hyperglycemia. Discussed these findings with patient recommended to follow-up with PCP for reevaluation of fasting blood glucose, hemoglobin A1c, and repeat potassium.  Discussed dietary changes to help with hypokalemia.  Ddx ACS vs MSK vs GERD. Low suspicion for PE or dissection with intermittent symptoms.  ASA given. Patient seen as physician in triage and recommended that he awaits for a bed for telemetry monitoring and continuity of care. Patient decided to leave since he is pain-free with so far negative work-up, after waiting for 7hrs and yet having several patients ahead of him in the [facility]. Patient was referred to cardiology and recommended no aerobic  exercises until cleared by cardiology, daily baby ASA and follow up with cardiology within the next few days. Recommended that he return to the emergency room if chest pain recurs, or if he changes his mind and wishes to continue his evaluation here.       _____________________________________________ Please note:  Patient was evaluated in Emergency Department today for the symptoms described in the history of present illness. Patient was evaluated in the context of the global COVID-19 pandemic, which necessitated consideration that the patient might be at risk for infection with the SARS-CoV-2 virus that causes COVID-19. Institutional protocols and algorithms that pertain to the evaluation of patients at risk for COVID-19 are in a state of rapid change based on information released  by regulatory bodies including the CDC and federal and state organizations. These policies and algorithms were followed during the patient's care in the ED.  Some ED evaluations and interventions may be delayed as a result of limited staffing during the pandemic.   [place] Controlled Substance Database was reviewed by me. ____________________________________________   FINAL CLINICAL IMPRESSION(S) / ED DIAGNOSES   Final diagnoses:  Chest pain, unspecified type  Hyperglycemia  Hypokalemia      NEW MEDICATIONS STARTED DURING THIS VISIT:  ED Discharge Orders    None       Note:  This document was prepared using Dragon voice recognition software and may include unintentional dictation errors.    Nita Sickle, MD 05/01/20 [phone]

## 2020-05-01 NOTE — ED Triage Notes (Signed)
Emergency Medicine Provider Triage Evaluation Note  Dale Phillips. , a 49 y.o. male  was evaluated in triage.  Pt complains of chest pain. No PMH. Has had intermittent L arm dull squeezing pain for 1 week with some R hand paresthesias. Today started to have mild L sided chest tightness associated with the arm pain. No SOB, no cough, no fever. Brother just recently had a massive heart attack. Patient is not smoker. Patient started to exercise recently lifting weights and doing cardio. Has noticed that 4-5 min into cardio exercises he develops a pain in his L axilla which resolves after 15 min of rest.   Review of Systems  Positive: Chest pain and L arm pain Negative: Cough, sob, fever, back pain  Physical Exam  BP (!) 130/93 (BP Location: Left Arm)   Pulse 71   Temp 97.6 F (36.4 C) (Oral)   Resp 18   Ht 5\' 8"  (1.727 m)   Wt 84.4 kg   SpO2 98%   BMI 28.28 kg/m  Gen:   Awake, no distress   HEENT:  Atraumatic  Resp:  Normal effort  Cardiac:  Normal rate  Abd:   Nondistended, nontender  MSK:   Moves extremities without difficulty  Neuro:  Speech clear   Medical Decision Making  Medically screening exam initiated at 2:13 AM.  Appropriate orders placed.  . was informed that the remainder of the evaluation will be completed by another provider, this initial triage assessment does not replace that evaluation, and the importance of remaining in the ED until their evaluation is complete.  Clinical Impression  23M with no PMH and FH of heart disease who presents for evaluation of CP and L arm pain. Also noticed L axilla pain with aerobic exercise. Patient is well appearing and no distress, normal vitals, normal pulses, no pain with palpation with the muscles of arm and chest, lungs CTAB, RRR.  EKG x 2 normal. HS trop x 2 negative. CXR WNL.   Ddx ACS vs MSK vs GERD. Low suspicion for PE or dissection with intermittent symptoms.  ASA given. Patient to await cardiac  monitoring on main side.   Forestine Chute, Don Perking, MD 05/01/20 [phone]

## 2020-05-01 NOTE — Discharge Instructions (Addendum)
You were seen for chest pain. Your workup today was reassuring. As I explained to you that does not mean that you do not have heart disease. You may need further evaluation to ensure you do not have a serious heart problem. Therefore it is imperative that you follow up with your cardiology in 1-2 days for further evaluation. Start a baby aspirin daily and avoid aerobic exercise until cleared by cardiology  When should you call for help?  Call 911 if: You passed out (lost consciousness) or if you feel dizzy. You have difficulty breathing. You have symptoms of a heart attack. These may include: Chest pain or pressure, or a strange feeling in your chest. Indigestion. Sweating. Shortness of breath. Nausea or vomiting. Pain, pressure, or a strange feeling in your back, neck, jaw, or upper belly or in one or both shoulders or arms. Lightheadedness or sudden weakness. A fast or irregular heartbeat. After you call 911, the operator may tell you to chew 1 adult-strength or 2 to 4 low-dose aspirin. Wait for an ambulance. Do not try to drive yourself.   Call your doctor today if: You have any trouble breathing. Your chest pain gets worse. You are dizzy or lightheaded, or you feel like you may faint. You are not getting better as expected. You are having new or different chest pain  How can you care for yourself at home? Rest until you feel better. Take your medicine exactly as prescribed. Call your doctor if you think you are having a problem with your medicine. Do not drive after taking a prescription pain medicine.

## 2021-08-23 IMAGING — CR DG CHEST 2V
1 series · 2 of 2 positions shown · non-contrast
Comparison: Radiograph 01/15/2017

CLINICAL DATA: Chest pain with left-sided radiation.

EXAM:
CHEST - 2 VIEW

[Series 1: dg chest 2 view · 0.14mm/px · 2 of 2 slices shown]
[im 1/2]
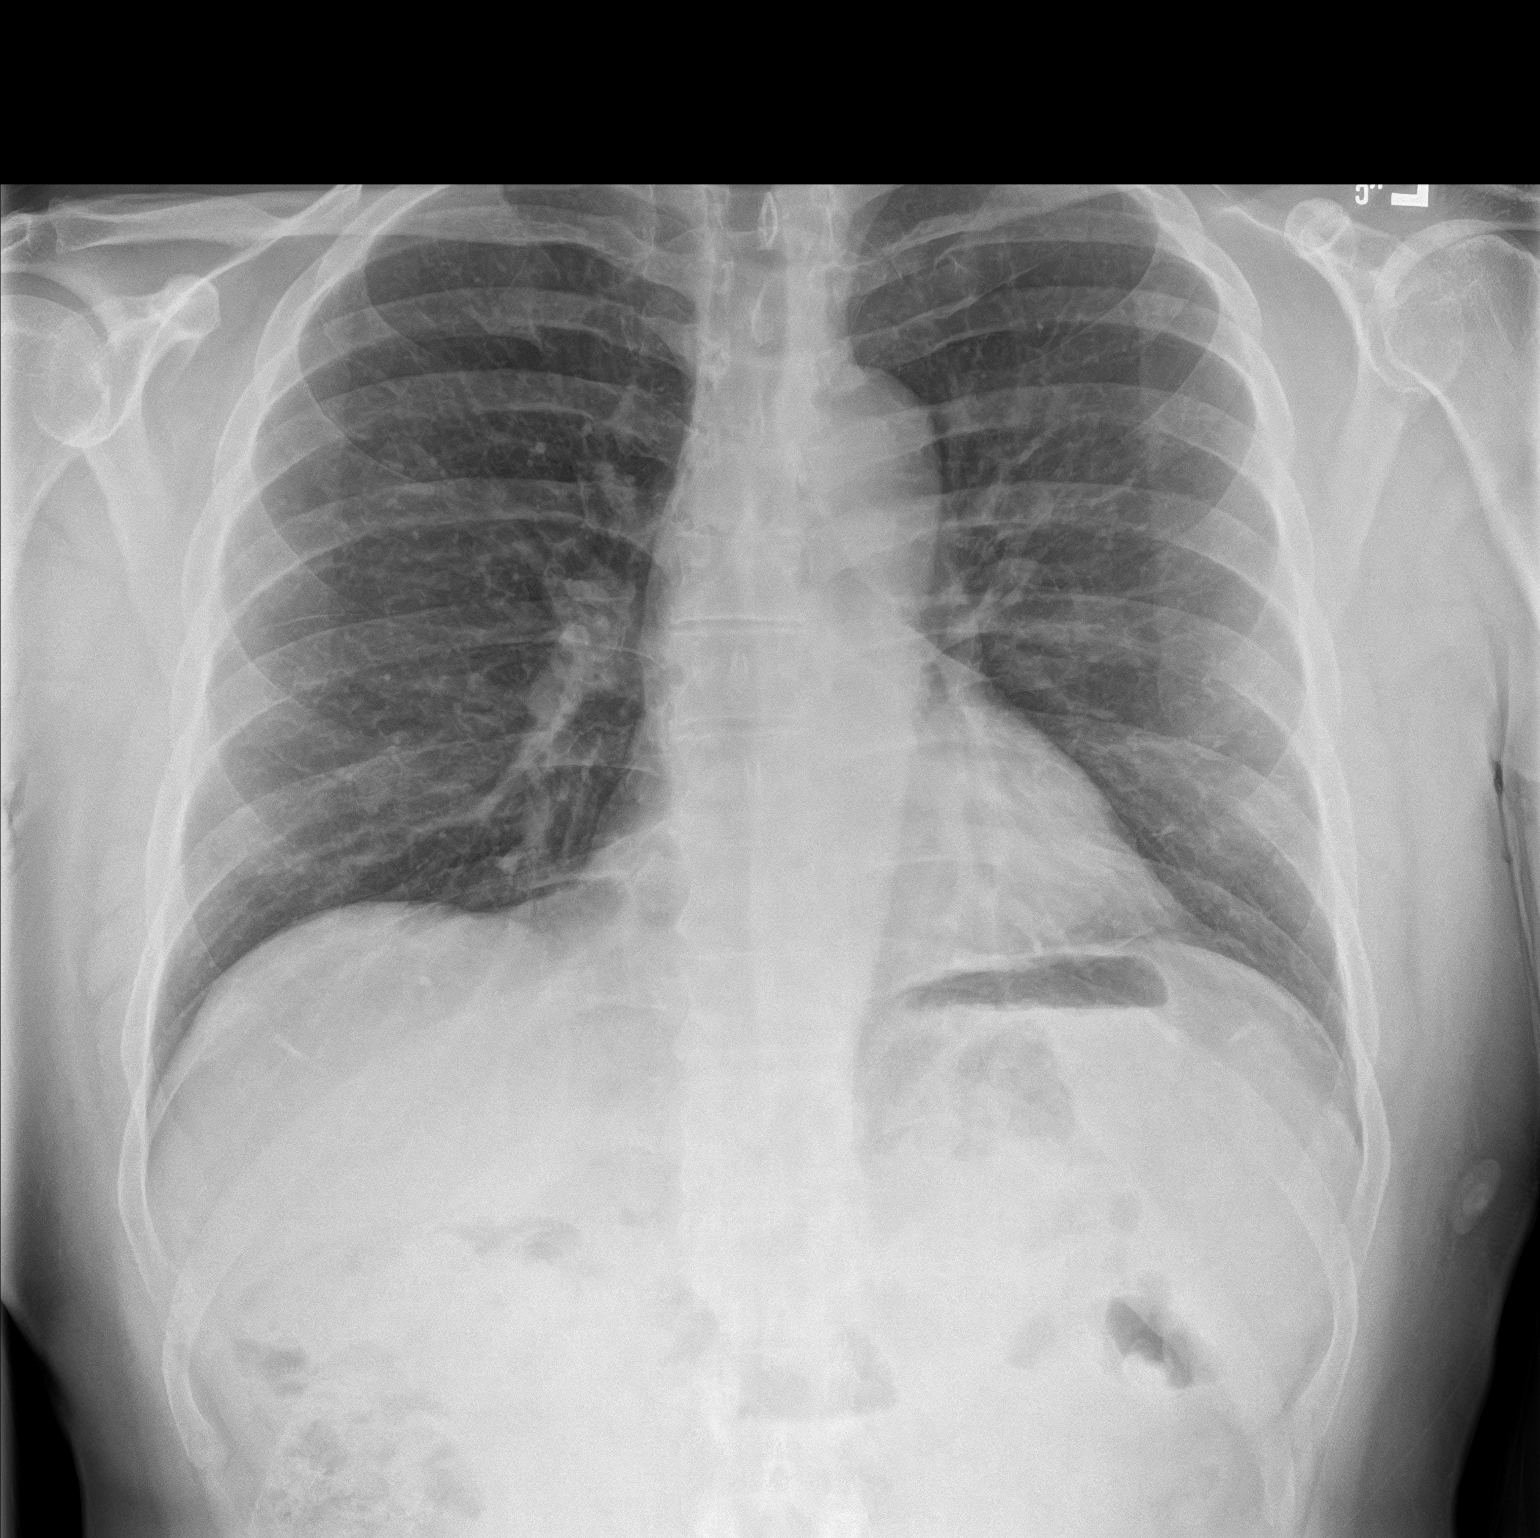
[im 2/2]
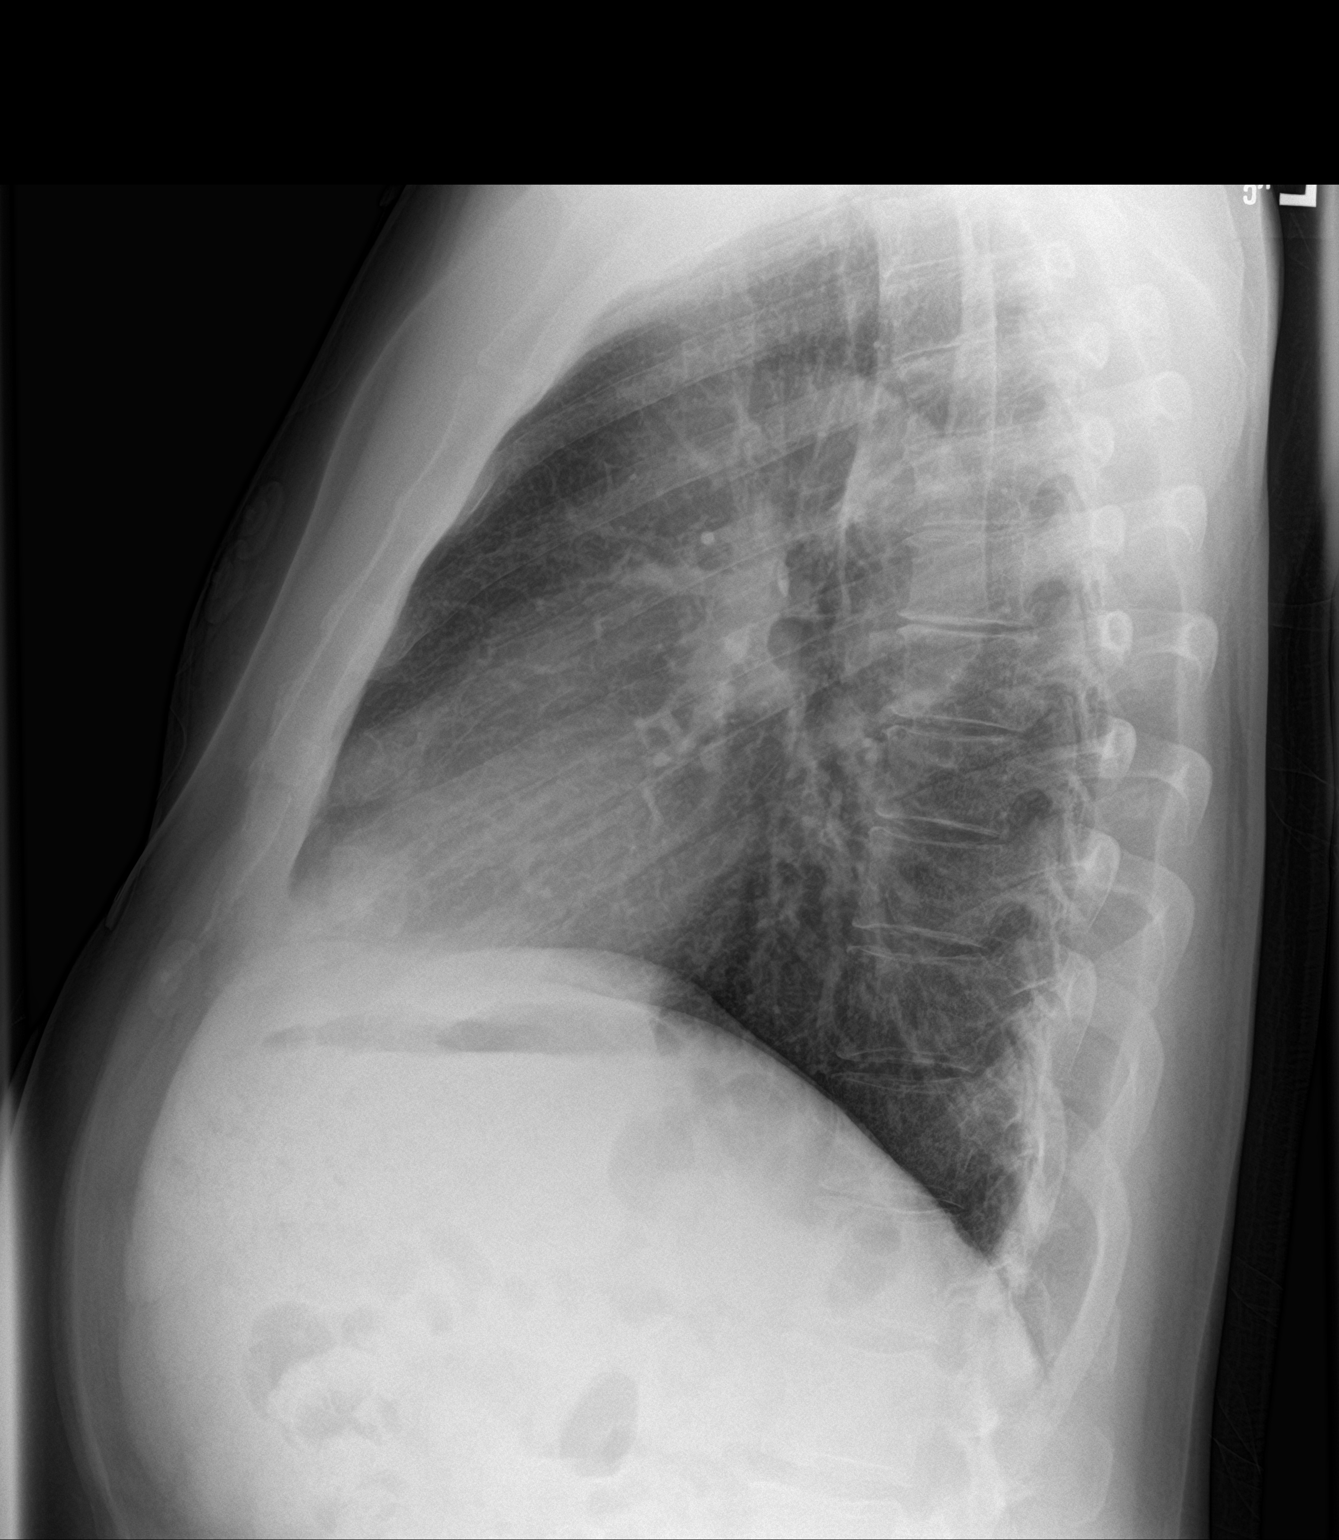

[2 of 2 positions shown; findings below may reference images not displayed]

FINDINGS: Normal heart size and mediastinal contours. There is subsegmental
opacities in both lung bases. No confluent consolidation. No pleural
fluid or pneumothorax. No acute osseous abnormalities are seen.
IMPRESSION: Subsegmental bibasilar opacities, favor atelectasis.
